# Patient Record
Sex: Female | Born: 1960 | Hispanic: No | Marital: Single | Smoking: Never smoker
Health system: Southern US, Community
[De-identification: ages and names within clinical notes are randomized; demographics above are authoritative.]

---

## 1999-05-10 ENCOUNTER — Ambulatory Visit (HOSPITAL_COMMUNITY): Admission: RE | Admit: 1999-05-10 | Discharge: 1999-05-10 | Payer: Self-pay | Admitting: *Deleted

## 1999-05-11 ENCOUNTER — Ambulatory Visit (HOSPITAL_COMMUNITY): Admission: RE | Admit: 1999-05-11 | Discharge: 1999-05-11 | Payer: Self-pay | Admitting: *Deleted

## 1999-06-16 ENCOUNTER — Inpatient Hospital Stay (HOSPITAL_COMMUNITY): Admission: AD | Admit: 1999-06-16 | Discharge: 1999-06-16 | Payer: Self-pay | Admitting: Obstetrics & Gynecology

## 1999-06-17 ENCOUNTER — Inpatient Hospital Stay (HOSPITAL_COMMUNITY): Admission: AD | Admit: 1999-06-17 | Discharge: 1999-06-19 | Payer: Self-pay | Admitting: *Deleted

## 2000-11-26 ENCOUNTER — Inpatient Hospital Stay (HOSPITAL_COMMUNITY): Admission: AD | Admit: 2000-11-26 | Discharge: 2000-11-26 | Payer: Self-pay | Admitting: *Deleted

## 2000-11-26 ENCOUNTER — Encounter (INDEPENDENT_AMBULATORY_CARE_PROVIDER_SITE_OTHER): Payer: Self-pay | Admitting: Specialist

## 2000-11-29 ENCOUNTER — Inpatient Hospital Stay (HOSPITAL_COMMUNITY): Admission: AD | Admit: 2000-11-29 | Discharge: 2000-11-29 | Payer: Self-pay | Admitting: *Deleted

## 2000-12-06 ENCOUNTER — Inpatient Hospital Stay (HOSPITAL_COMMUNITY): Admission: AD | Admit: 2000-12-06 | Discharge: 2000-12-06 | Payer: Self-pay | Admitting: Obstetrics & Gynecology

## 2000-12-13 ENCOUNTER — Inpatient Hospital Stay (HOSPITAL_COMMUNITY): Admission: AD | Admit: 2000-12-13 | Discharge: 2000-12-13 | Payer: Self-pay | Admitting: Obstetrics & Gynecology

## 2001-01-31 ENCOUNTER — Other Ambulatory Visit: Admission: RE | Admit: 2001-01-31 | Discharge: 2001-01-31 | Payer: Self-pay | Admitting: Family Medicine

## 2005-10-26 ENCOUNTER — Other Ambulatory Visit: Admission: RE | Admit: 2005-10-26 | Discharge: 2005-10-26 | Payer: Self-pay | Admitting: Family Medicine

## 2007-11-01 ENCOUNTER — Emergency Department (HOSPITAL_COMMUNITY): Admission: EM | Admit: 2007-11-01 | Discharge: 2007-11-01 | Payer: Self-pay | Admitting: Emergency Medicine

## 2008-01-13 ENCOUNTER — Other Ambulatory Visit: Admission: RE | Admit: 2008-01-13 | Discharge: 2008-01-13 | Payer: Self-pay | Admitting: Family Medicine

## 2010-02-20 ENCOUNTER — Other Ambulatory Visit: Admission: RE | Admit: 2010-02-20 | Discharge: 2010-02-20 | Payer: Self-pay | Admitting: Family Medicine

## 2011-08-27 LAB — DIFFERENTIAL
Basophils Absolute: 0.1
Basophils Relative: 1
Eosinophils Absolute: 0.1 — ABNORMAL LOW
Eosinophils Relative: 1
Lymphocytes Relative: 20
Lymphs Abs: 1.6
Monocytes Absolute: 0.7
Monocytes Relative: 9
Neutro Abs: 5.4
Neutrophils Relative %: 68

## 2011-08-27 LAB — COMPREHENSIVE METABOLIC PANEL
ALT: 66 — ABNORMAL HIGH
AST: 44 — ABNORMAL HIGH
Albumin: 3.9
Alkaline Phosphatase: 73
BUN: 9
CO2: 29
Calcium: 9.3
Chloride: 105
Creatinine, Ser: 0.67
GFR calc Af Amer: >60
GFR calc non Af Amer: >60
Glucose, Bld: 92
Potassium: 3.7
Sodium: 140
Total Bilirubin: 1.3 — ABNORMAL HIGH
Total Protein: 8.1

## 2011-08-27 LAB — CBC
HCT: 40.3
Hemoglobin: 13.2
MCHC: 32.8
MCV: 82.7
Platelets: 242
RBC: 4.87
RDW: 12.5
WBC: 7.9

## 2013-09-08 ENCOUNTER — Other Ambulatory Visit: Payer: Self-pay | Admitting: Family Medicine

## 2013-09-08 ENCOUNTER — Other Ambulatory Visit (HOSPITAL_COMMUNITY)
Admission: RE | Admit: 2013-09-08 | Discharge: 2013-09-08 | Disposition: A | Discharge status: Home / Self Care | Payer: BC Managed Care – PPO | Source: Ambulatory Visit | Attending: Family Medicine | Admitting: Family Medicine

## 2013-09-08 DIAGNOSIS — Z0000 Encounter for general adult medical examination without abnormal findings: Secondary | ICD-10-CM | POA: Insufficient documentation

## 2017-04-10 LAB — GLUCOSE, POCT (MANUAL RESULT ENTRY): POC Glucose: 128 mg/dl — AB (ref 70–99)

## 2017-04-26 ENCOUNTER — Encounter: Payer: Self-pay | Admitting: Internal Medicine

## 2017-04-26 ENCOUNTER — Ambulatory Visit (HOSPITAL_COMMUNITY)
Admission: RE | Admit: 2017-04-26 | Discharge: 2017-04-26 | Disposition: A | Discharge status: Home / Self Care | Payer: Self-pay | Source: Ambulatory Visit | Attending: Internal Medicine | Admitting: Internal Medicine

## 2017-04-26 ENCOUNTER — Ambulatory Visit: Payer: Self-pay | Attending: Internal Medicine | Admitting: Internal Medicine

## 2017-04-26 DIAGNOSIS — M545 Low back pain, unspecified: Secondary | ICD-10-CM

## 2017-04-26 DIAGNOSIS — Z13220 Encounter for screening for lipoid disorders: Secondary | ICD-10-CM

## 2017-04-26 DIAGNOSIS — M79672 Pain in left foot: Secondary | ICD-10-CM | POA: Insufficient documentation

## 2017-04-26 DIAGNOSIS — H6121 Impacted cerumen, right ear: Secondary | ICD-10-CM | POA: Insufficient documentation

## 2017-04-26 DIAGNOSIS — M7732 Calcaneal spur, left foot: Secondary | ICD-10-CM | POA: Insufficient documentation

## 2017-04-26 DIAGNOSIS — Z860100 Personal history of colon polyps, unspecified: Secondary | ICD-10-CM | POA: Insufficient documentation

## 2017-04-26 DIAGNOSIS — M419 Scoliosis, unspecified: Secondary | ICD-10-CM | POA: Insufficient documentation

## 2017-04-26 DIAGNOSIS — J0100 Acute maxillary sinusitis, unspecified: Secondary | ICD-10-CM

## 2017-04-26 DIAGNOSIS — G8929 Other chronic pain: Secondary | ICD-10-CM | POA: Insufficient documentation

## 2017-04-26 DIAGNOSIS — H538 Other visual disturbances: Secondary | ICD-10-CM

## 2017-04-26 DIAGNOSIS — M4186 Other forms of scoliosis, lumbar region: Secondary | ICD-10-CM | POA: Insufficient documentation

## 2017-04-26 LAB — POCT GLYCOSYLATED HEMOGLOBIN (HGB A1C): Hemoglobin A1C: 5.1

## 2017-04-26 MED ORDER — AMOXICILLIN-POT CLAVULANATE 875-125 MG PO TABS: 1.0000 | ORAL_TABLET | Freq: Two times a day (BID) | ORAL | 0 refills | Status: AC

## 2017-04-26 MED ORDER — NAPROXEN 500 MG PO TABS: 500.0000 mg | ORAL_TABLET | Freq: Two times a day (BID) | ORAL | 0 refills | Status: AC | PRN

## 2017-04-26 MED FILL — AMOX-CLAV 875-125 MG TABLET: 875-125 | 7 days supply | Qty: 14 | Fill #0

## 2017-04-26 MED FILL — NAPROXEN 500 MG TABLET: 500 | 15 days supply | Qty: 30 | Fill #0

## 2017-04-26 NOTE — Addendum Note (Signed)
Addended by: Jonah BlueJOHNSON, Jodette Wik B on: 04/26/2017 06:52 PM   Modules accepted: Orders

## 2017-04-26 NOTE — Addendum Note (Signed)
Addended by: Red ChristiansLISBON, NUBIA on: 04/26/2017 02:53 PM   Modules accepted: Orders

## 2017-04-26 NOTE — Progress Notes (Addendum)
Patient ID: Kayla Rowe, female    DOB: Dec 21, 1960  MRN: [id number]  CC: Establish Care   Subjective: Kayla Rowe is a 56 y.o. female who presents for new pt visit Her concerns today include:  [place] from [place].  [place].  Interpreter from [employer] [place], is with her.  No chronic medical issues   1. Sick since last wk -sneezing, sore throat, nasal congestion, itchy eyes. -A lot of pressure over the last maxillary sinuses. Reports low-grade fevers and sinus drainage -no cough, or SOB taking honey and lemon mixture and Motrin  2. C/o pain in LT heel and left lower back x 3 mths -constant achy and sharp.  Like I'm stepping on a stone. Worse with walking -No known injury to the foot  No injury to back- -pain in lower back LT side started around same time as foot pain -LBP constant, does not radiate -no numbness or tingling --takes Motrin occasionally.   3.  Had colonoscopy 05/2015 through [facility] [place]  Had 3 polys removed.  Brings copy of colonoscopy report.  -Reports states she will be due again in 3-5 years from 2016  4.  Request to see eye doctor.  Had surgery 3 yrs ago ? ptygerygum -blurred vision at times -restricted to driving in day time  No current outpatient prescriptions on file prior to visit.   No current facility-administered medications on file prior to visit.     Allergies not on file  Social History   Social History  . Marital status: Single    Spouse name: N/A  . Number of children: N/A  . Years of education: N/A   Occupational History  . Not on file.   Social History Main Topics  . Smoking status: Never Smoker  . Smokeless tobacco: Never Used  . Alcohol use No  . Drug use: No  . Sexual activity: Not on file   Other Topics Concern  . Not on file   Social History Narrative  . No narrative on file    No family history on file.  No past surgical history on file.  ROS: Review of  Systems As stated above PHYSICAL EXAM: BP 139/71 (BP Location: Left Arm, Patient Position: Sitting, Cuff Size: Normal)   Pulse (!) 58   Temp 98.8 F (37.1 C) (Oral)   Resp 16   Ht 4\' 9"  (1.448 m)   Wt 128 lb 9.6 oz (58.3 kg)   LMP 01/17/2005 (Approximate)   SpO2 100%   BMI 27.83 kg/m   Physical Exam  General appearance - alert, well appearing, middle-aged female in no acute distress  Mental status - patient is alert and answers questions appropriately Ears - some scarring right posterior ear lobe.  Small amount of wax buildup in the right ear obscuring view of the TM.  Left ear canal and membrane within normal limits Nose -moderate enlargement of the nasal turbinates Mouth -no oral lesions, she has significant amount of thick yellow mucus drainage at the back of the throat Neck - no cervical or axillary lymphadenopathy  Chest - clear to auscultation, no wheezes, rales or rhonchi, symmetric air entry Heart - normal rate, regular rhythm, normal S1, S2, no murmurs, rubs, clicks or gallops Musculoskeletal - left foot: no edema.  Mild to moderate tenderness on palpation of the heel.  Lower back:  Mild tenderness on palpation of the left lumbar paraspinal muscles. Straight leg raise negative.  Extremities - no lower  extremity edema Depression screen [facility] 2/9 04/26/2017  Decreased Interest 1  Down, Depressed, Hopeless 0  PHQ - 2 Score 1  Altered sleeping 0  Tired, decreased energy 2  Change in appetite 0  Feeling bad or failure about yourself  0  Trouble concentrating 0  Moving slowly or fidgety/restless 0  Suicidal thoughts 0  PHQ-9 Score 3   GAD 7 : Generalized Anxiety Score 04/26/2017  Nervous, Anxious, on Edge 1  Control/stop worrying 1  Worry too much - different things 1  Trouble relaxing 1  Restless 0  Easily annoyed or irritable 0  Afraid - awful might happen 0  Total GAD 7 Score 4   A1C 5.1`  ASSESSMENT AND PLAN: 1. Acute maxillary sinusitis, recurrence not  specified -She also has some allergy symptoms for which she can use Claritin over-the-counter - CBC - Comprehensive metabolic panel - amoxicillin-clavulanate (AUGMENTIN) 875-125 MG tablet; Take 1 tablet by mouth 2 (two) times daily.  Dispense: 14 tablet; Refill: 0  2. Chronic left-sided low back pain without sciatica -Likely MSK in nature. Avoid any heavy lifting pushing or pulling. - naproxen (NAPROSYN) 500 MG tablet; Take 1 tablet (500 mg total) by mouth 2 (two) times daily as needed.  Dispense: 30 tablet; Refill: 0 - DG Lumbar Spine Complete; Future  3. Pain of left heel -Differential diagnoses include plantar fasciitis versus heel spur -Heel exercises demonstrated and encouraged -Also advise purchasing Dr. Margart Sickles heel inserts over-the-counter and using - DG Foot Complete Left; Future - naproxen (NAPROSYN) 500 MG tablet; Take 1 tablet (500 mg total) by mouth 2 (two) times daily as needed.  Dispense: 30 tablet; Refill: 0  4. Blurred vision - Ambulatory referral to Ophthalmology  5. Hx of colonic polyp -Will be due for repeat colonoscopy in July of next year  6. Screening cholesterol level - Lipid panel  7. Impacted cerumen of right ear Recommended use of over-the-counter wax softener. We can reevaluate on next visit to see if she needs irrigation at that point  Patient was given the opportunity to ask questions.  Patient verbalized understanding of the plan and was able to repeat key elements of the plan.   ADDENDUM:  X-ray foot confirmed bone spurr.  X-ray of LS revealed mild scoliosis.  Will refer to Podiatry.   Orders Placed This Encounter  Procedures  . DG Foot Complete Left  . DG Lumbar Spine Complete  . CBC  . Comprehensive metabolic panel  . Lipid panel  . Ambulatory referral to Ophthalmology     Requested Prescriptions   Signed Prescriptions Disp Refills  . naproxen (NAPROSYN) 500 MG tablet 30 tablet 0    Sig: Take 1 tablet (500 mg total) by mouth 2 (two)  times daily as needed.  Marland Kitchen amoxicillin-clavulanate (AUGMENTIN) 875-125 MG tablet 14 tablet 0    Sig: Take 1 tablet by mouth 2 (two) times daily.   30 minutes spent on direct face-to-face evaluation, counseling, and coordination of care. Return in about 6 weeks (around 06/07/2017) for pap/breast exam.  Jonah Blue, MD, Jerrel Ivory

## 2017-04-26 NOTE — Patient Instructions (Signed)
Give appointment with [name]. Purchase Dr. Margart SicklesScholl's over the counter.  Plantar Fasciitis Plantar fasciitis is a painful foot condition that affects the heel. It occurs when the band of tissue that connects the toes to the heel bone (plantar fascia) becomes irritated. This can happen after exercising too much or doing other repetitive activities (overuse injury). The pain from plantar fasciitis can range from mild irritation to severe pain that makes it difficult for you to walk or move. The pain is usually worse in the morning or after you have been sitting or lying down for a while. What are the causes? This condition may be caused by:  Standing for long periods of time.  Wearing shoes that do not fit.  Doing high-impact activities, including running, aerobics, and ballet.  Being overweight.  Having an abnormal way of walking (gait).  Having tight calf muscles.  Having high arches in your feet.  Starting a new athletic activity.  What are the signs or symptoms? The main symptom of this condition is heel pain. Other symptoms include:  Pain that gets worse after activity or exercise.  Pain that is worse in the morning or after resting.  Pain that goes away after you walk for a few minutes.  How is this diagnosed? This condition may be diagnosed based on your signs and symptoms. Your health care provider will also do a physical exam to check for:  A tender area on the bottom of your foot.  A high arch in your foot.  Pain when you move your foot.  Difficulty moving your foot.  You may also need to have imaging studies to confirm the diagnosis. These can include:  X-rays.  Ultrasound.  MRI.  How is this treated? Treatment for plantar fasciitis depends on the severity of the condition. Your treatment may include:  Rest, ice, and over-the-counter pain medicines to manage your pain.  Exercises to stretch your calves and your plantar fascia.  A splint that holds  your foot in a stretched, upward position while you sleep (night splint).  Physical therapy to relieve symptoms and prevent problems in the future.  Cortisone injections to relieve severe pain.  Extracorporeal shock wave therapy (ESWT) to stimulate damaged plantar fascia with electrical impulses. It is often used as a last resort before surgery.  Surgery, if other treatments have not worked after 12 months.  Follow these instructions at home:  Take medicines only as directed by your health care provider.  Avoid activities that cause pain.  Roll the bottom of your foot over a bag of ice or a bottle of cold water. Do this for 20 minutes, 3-4 times a day.  Perform simple stretches as directed by your health care provider.  Try wearing athletic shoes with air-sole or gel-sole cushions or soft shoe inserts.  Wear a night splint while sleeping, if directed by your health care provider.  Keep all follow-up appointments with your health care provider. How is this prevented?  Do not perform exercises or activities that cause heel pain.  Consider finding low-impact activities if you continue to have problems.  Lose weight if you need to. The best way to prevent plantar fasciitis is to avoid the activities that aggravate your plantar fascia. Contact a health care provider if:  Your symptoms do not go away after treatment with home care measures.  Your pain gets worse.  Your pain affects your ability to move or do your daily activities. This information is not intended to replace  advice given to you by your health care provider. Make sure you discuss any questions you have with your health care provider. Document Released: 07/31/2001 Document Revised: 04/09/2016 Document Reviewed: 09/15/2014 Elsevier Interactive Patient Education  [employer].

## 2017-04-29 ENCOUNTER — Ambulatory Visit: Payer: Self-pay

## 2017-05-15 ENCOUNTER — Ambulatory Visit: Payer: Self-pay

## 2017-05-31 ENCOUNTER — Ambulatory Visit: Payer: Self-pay | Attending: Internal Medicine

## 2017-06-07 ENCOUNTER — Encounter: Payer: Self-pay | Admitting: Internal Medicine

## 2017-06-07 ENCOUNTER — Ambulatory Visit: Payer: Medicaid Other | Attending: Internal Medicine | Admitting: Internal Medicine

## 2017-06-07 ENCOUNTER — Ambulatory Visit: Payer: Self-pay | Admitting: Internal Medicine

## 2017-06-07 ENCOUNTER — Ambulatory Visit: Payer: Self-pay

## 2017-06-07 VITALS — BP 125/70 | HR 72 | Temp 98.2°F | Resp 16 | Wt 129.0 lb

## 2017-06-07 DIAGNOSIS — Z86010 Personal history of colonic polyps: Secondary | ICD-10-CM | POA: Insufficient documentation

## 2017-06-07 DIAGNOSIS — Z124 Encounter for screening for malignant neoplasm of cervix: Secondary | ICD-10-CM

## 2017-06-07 DIAGNOSIS — G8929 Other chronic pain: Secondary | ICD-10-CM | POA: Insufficient documentation

## 2017-06-07 DIAGNOSIS — Z8249 Family history of ischemic heart disease and other diseases of the circulatory system: Secondary | ICD-10-CM | POA: Insufficient documentation

## 2017-06-07 DIAGNOSIS — Z801 Family history of malignant neoplasm of trachea, bronchus and lung: Secondary | ICD-10-CM | POA: Insufficient documentation

## 2017-06-07 DIAGNOSIS — M545 Low back pain: Secondary | ICD-10-CM | POA: Insufficient documentation

## 2017-06-07 DIAGNOSIS — Z0000 Encounter for general adult medical examination without abnormal findings: Secondary | ICD-10-CM | POA: Diagnosis not present

## 2017-06-07 DIAGNOSIS — M79672 Pain in left foot: Secondary | ICD-10-CM | POA: Insufficient documentation

## 2017-06-07 DIAGNOSIS — Z23 Encounter for immunization: Secondary | ICD-10-CM

## 2017-06-07 DIAGNOSIS — Z1239 Encounter for other screening for malignant neoplasm of breast: Secondary | ICD-10-CM

## 2017-06-07 DIAGNOSIS — Z1231 Encounter for screening mammogram for malignant neoplasm of breast: Secondary | ICD-10-CM | POA: Diagnosis not present

## 2017-06-07 NOTE — Progress Notes (Signed)
Patient ID: Kayla Rowe, female    DOB: Dec 02, 1960  MRN: [id number]  CC: Gynecologic Exam   Subjective: Kayla Rowe is a 56 y.o. female who presents for well woman exam Her concerns today include:  Last MMG was 3 yrs ago -no abnormal MMG in past Pt is G3P2 (miscarriage) -last pap was 4 yrs ago. No abnormals -she is postmenopausal and no postmenopausal bleeding -no vaginal dischg or itching -sexually active with husband only   Patient Active Problem List   Diagnosis Date Noted  . Chronic left-sided low back pain without sciatica 04/26/2017  . Pain of left heel 04/26/2017  . Hx of colonic polyp 04/26/2017     Current Outpatient Prescriptions on File Prior to Visit  Medication Sig Dispense Refill  . amoxicillin-clavulanate (AUGMENTIN) 875-125 MG tablet Take 1 tablet by mouth 2 (two) times daily. (Patient not taking: Reported on 06/07/2017) 14 tablet 0  . naproxen (NAPROSYN) 500 MG tablet Take 1 tablet (500 mg total) by mouth 2 (two) times daily as needed. (Patient not taking: Reported on 06/07/2017) 30 tablet 0   No current facility-administered medications on file prior to visit.     Not on File  Social History   Social History  . Marital status: Single    Spouse name: N/A  . Number of children: N/A  . Years of education: N/A   Occupational History  . Not on file.   Social History Main Topics  . Smoking status: Never Smoker  . Smokeless tobacco: Never Used  . Alcohol use No  . Drug use: No  . Sexual activity: Yes    Birth control/ protection: None   Other Topics Concern  . Not on file   Social History Narrative  . No narrative on file    Family History  Problem Relation Age of Onset  . Hypertension Mother   . Asthma Mother   . Lung cancer Father     No past surgical history on file.  ROS: Review of Systems  Constitutional: Negative for appetite change.  Respiratory: Negative for cough, chest tightness and shortness of breath.     Cardiovascular: Negative for chest pain and leg swelling.  Gastrointestinal: Negative for abdominal distention and blood in stool.    PHYSICAL EXAM: BP 125/70   Pulse 72   Temp 98.2 F (36.8 C) (Oral)   Resp 16   Wt 129 lb (58.5 kg)   SpO2 97%   BMI 27.92 kg/m   Physical Exam General appearance - alert, well appearing, and in no distress Mental status - alert, oriented to person, place, and time, normal mood, behavior, speech, dress, motor activity, and thought processes Eyes - pupils equal and reactive, extraocular eye movements intact Mouth - mucous membranes moist, pharynx normal without lesions Neck - supple, no significant adenopathy Lymphatics - no palpable lymphadenopathy, no hepatosplenomegaly Chest - clear to auscultation, no wheezes, rales or rhonchi, symmetric air entry Heart - normal rate, regular rhythm, normal S1, S2, no murmurs, rubs, clicks or gallops Abdomen - soft, nontender, nondistended, no masses or organomegaly Breasts - breasts appear normal, no suspicious masses, no skin or nipple changes or axillary nodes Pelvic - CMA Pollock present. normal external genitalia, vulva, vagina, cervix, uterus and adnexa. She had mild stenosis of cervical canal Extremities - peripheral pulses normal, no pedal edema, no clubbing or cyanosis  ASSESSMENT AND PLAN: 1. Encounter for annual physical exam - CBC - Comprehensive metabolic panel - Lipid panel - Hepatitis c antibody (reflex)  2. Pap smear for cervical cancer screening - Cytology - PAP  3. Breast cancer screening Voucher for MMG given  4. Need for Tdap vaccination    Patient was given the opportunity to ask questions.  Patient verbalized understanding of the plan and was able to repeat key elements of the plan.   Orders Placed This Encounter  Procedures  . MM Digital Screening  . Tdap vaccine greater than or equal to 7yo IM  . CBC  . Comprehensive metabolic panel  . Lipid panel  . Hepatitis c antibody  (reflex)  . HCV Comment:     Requested Prescriptions    No prescriptions requested or ordered in this encounter    Return if symptoms worsen or fail to improve.  Jonah Blueeborah Clifford Coudriet, MD, FACP

## 2017-06-08 LAB — COMPREHENSIVE METABOLIC PANEL
A/G RATIO: 1.3 (ref 1.2–2.2)
ALT: 29 IU/L (ref 0–32)
AST: 29 IU/L (ref 0–40)
Albumin: 4.3 g/dL (ref 3.5–5.5)
Alkaline Phosphatase: 61 IU/L (ref 39–117)
BILIRUBIN TOTAL: 0.8 mg/dL (ref 0.0–1.2)
BUN/Creatinine Ratio: 14 (ref 9–23)
BUN: 10 mg/dL (ref 6–24)
CALCIUM: 9 mg/dL (ref 8.7–10.2)
CHLORIDE: 104 mmol/L (ref 96–106)
CO2: 25 mmol/L (ref 20–29)
Creatinine, Ser: 0.71 mg/dL (ref 0.57–1.00)
GFR calc Af Amer: 111 mL/min/{1.73_m2} (ref >59)
GFR calc non Af Amer: 96 mL/min/{1.73_m2} (ref >59)
Globulin, Total: 3.2 g/dL (ref 1.5–4.5)
Glucose: 88 mg/dL (ref 65–99)
POTASSIUM: 4.5 mmol/L (ref 3.5–5.2)
Sodium: 143 mmol/L (ref 134–144)
Total Protein: 7.5 g/dL (ref 6.0–8.5)

## 2017-06-08 LAB — CBC
Hematocrit: 41.2 % (ref 34.0–46.6)
Hemoglobin: 13 g/dL (ref 11.1–15.9)
MCH: 27 pg (ref 26.6–33.0)
MCHC: 31.6 g/dL (ref 31.5–35.7)
MCV: 86 fL (ref 79–97)
Platelets: 218 10*3/uL (ref 150–379)
RBC: 4.82 x10E6/uL (ref 3.77–5.28)
RDW: 14.2 % (ref 12.3–15.4)
WBC: 5.5 10*3/uL (ref 3.4–10.8)

## 2017-06-08 LAB — HEPATITIS C ANTIBODY (REFLEX): HCV Ab: <0.1 s/co ratio (ref 0.0–0.9)

## 2017-06-08 LAB — LIPID PANEL
Chol/HDL Ratio: 2.9 ratio (ref 0.0–4.4)
Cholesterol, Total: 156 mg/dL (ref 100–199)
HDL: 53 mg/dL (ref >39)
LDL Calculated: 80 mg/dL (ref 0–99)
TRIGLYCERIDES: 114 mg/dL (ref 0–149)
VLDL Cholesterol Cal: 23 mg/dL (ref 5–40)

## 2017-06-08 LAB — HCV COMMENT:

## 2017-06-11 LAB — CYTOLOGY - PAP
Diagnosis: NEGATIVE
HPV: NOT DETECTED

## 2017-06-13 NOTE — Patient Instructions (Signed)
Td Vaccine (Tetanus and Diphtheria): What You Need to Know 1. Why get vaccinated? Tetanus  and diphtheria are very serious diseases. They are rare in the [place] today, but people who do become infected often have severe complications. Td vaccine is used to protect adolescents and adults from both of these diseases. Both tetanus and diphtheria are infections caused by bacteria. Diphtheria spreads from person to person through coughing or sneezing. Tetanus-causing bacteria enter the body through cuts, scratches, or wounds. TETANUS (lockjaw) causes painful muscle tightening and stiffness, usually all over the body.  It can lead to tightening of muscles in the head and neck so you can't open your mouth, swallow, or sometimes even breathe. Tetanus kills about 1 out of every 10 people who are infected even after receiving the best medical care.  DIPHTHERIA can cause a thick coating to form in the back of the throat.  It can lead to breathing problems, paralysis, heart failure, and death.  Before vaccines, as many as 200,000 cases of diphtheria and hundreds of cases of tetanus were reported in the [place] each year. Since vaccination began, reports of cases for both diseases have dropped by about 99%. 2. Td vaccine Td vaccine can protect adolescents and adults from tetanus and diphtheria. Td is usually given as a booster dose every 10 years but it can also be given earlier after a severe and dirty wound or burn. Another vaccine, called Tdap, which protects against pertussis in addition to tetanus and diphtheria, is sometimes recommended instead of Td vaccine. Your doctor or the person giving you the vaccine can give you more information. Td may safely be given at the same time as other vaccines. 3. Some people should not get this vaccine  A person who has ever had a life-threatening allergic reaction after a previous dose of any tetanus or diphtheria containing vaccine, OR has a severe  allergy to any part of this vaccine, should not get Td vaccine. Tell the person giving the vaccine about any severe allergies.  Talk to your doctor if you: ? had severe pain or swelling after any vaccine containing diphtheria or tetanus, ? ever had a condition called Guillain Barre Syndrome (GBS), ? aren't feeling well on the day the shot is scheduled. 4. What are the risks from Td vaccine? With any medicine, including vaccines, there is a chance of side effects. These are usually mild and go away on their own. Serious reactions are also possible but are rare. Most people who get Td vaccine do not have any problems with it. Mild problems following Td vaccine: (Did not interfere with activities)  Pain where the shot was given (about 8 people in 10)  Redness or swelling where the shot was given (about 1 person in 4)  Mild fever (rare)  Headache (about 1 person in 4)  Tiredness (about 1 person in 4)  Moderate problems following Td vaccine: (Interfered with activities, but did not require medical attention)  Fever over 102F (rare)  Severe problems following Td vaccine: (Unable to perform usual activities; required medical attention)  Swelling, severe pain, bleeding and/or redness in the arm where the shot was given (rare).  Problems that could happen after any vaccine:  People sometimes faint after a medical procedure, including vaccination. Sitting or lying down for about 15 minutes can help prevent fainting, and injuries caused by a fall. Tell your doctor if you feel dizzy, or have vision changes or ringing in the ears.  Some people get   severe pain in the shoulder and have difficulty moving the arm where a shot was given. This happens very rarely.  Any medication can cause a severe allergic reaction. Such reactions from a vaccine are very rare, estimated at fewer than 1 in a million doses, and would happen within a few minutes to a few hours after the vaccination. As with any  medicine, there is a very remote chance of a vaccine causing a serious injury or death. The safety of vaccines is always being monitored. For more information, visit: www.cdc.gov/vaccinesafety/ 5. What if there is a serious reaction? What should I look for? Look for anything that concerns you, such as signs of a severe allergic reaction, very high fever, or unusual behavior. Signs of a severe allergic reaction can include hives, swelling of the face and throat, difficulty breathing, a fast heartbeat, dizziness, and weakness. These would usually start a few minutes to a few hours after the vaccination. What should I do?  If you think it is a severe allergic reaction or other emergency that can't wait, call 9-1-1 or get the person to the nearest hospital. Otherwise, call your doctor.  Afterward, the reaction should be reported to the Vaccine Adverse Event Reporting System (VAERS). Your doctor might file this report, or you can do it yourself through the VAERS web site at www.vaers.hhs.gov, or by calling 1-800-822-7967. ? VAERS does not give medical advice. 6. The National Vaccine Injury Compensation Program The National Vaccine Injury Compensation Program (VICP) is a federal program that was created to compensate people who may have been injured by certain vaccines. Persons who believe they may have been injured by a vaccine can learn about the program and about filing a claim by calling 1-800-338-2382 or visiting the VICP website at www.hrsa.gov/vaccinecompensation. There is a time limit to file a claim for compensation. 7. How can I learn more?  Ask your doctor. He or she can give you the vaccine package insert or suggest other sources of information.  Call your local or state health department.  Contact the Centers for Disease Control and Prevention (CDC): ? Call 1-800-232-4636 (1-800-CDC-INFO) ? Visit CDC's website at www.cdc.gov/vaccines CDC Td Vaccine VIS (02/28/16) This information is  not intended to replace advice given to you by your health care provider. Make sure you discuss any questions you have with your health care provider. Document Released: 09/02/2006 Document Revised: 07/26/2016 Document Reviewed: 07/26/2016 Elsevier Interactive Patient Education  2017 Elsevier Inc.  

## 2017-06-21 ENCOUNTER — Ambulatory Visit: Payer: Self-pay | Attending: Internal Medicine

## 2020-09-15 DIAGNOSIS — Z131 Encounter for screening for diabetes mellitus: Secondary | ICD-10-CM | POA: Diagnosis not present

## 2020-09-15 DIAGNOSIS — M549 Dorsalgia, unspecified: Secondary | ICD-10-CM | POA: Diagnosis not present

## 2020-09-15 DIAGNOSIS — M62830 Muscle spasm of back: Secondary | ICD-10-CM | POA: Diagnosis not present

## 2020-09-15 DIAGNOSIS — R252 Cramp and spasm: Secondary | ICD-10-CM | POA: Diagnosis not present

## 2020-11-04 DIAGNOSIS — R059 Cough, unspecified: Secondary | ICD-10-CM | POA: Diagnosis not present

## 2020-11-04 DIAGNOSIS — Z20828 Contact with and (suspected) exposure to other viral communicable diseases: Secondary | ICD-10-CM | POA: Diagnosis not present

## 2020-12-12 DIAGNOSIS — Z20828 Contact with and (suspected) exposure to other viral communicable diseases: Secondary | ICD-10-CM | POA: Diagnosis not present

## 2020-12-16 DIAGNOSIS — Z20828 Contact with and (suspected) exposure to other viral communicable diseases: Secondary | ICD-10-CM | POA: Diagnosis not present

## 2020-12-17 DIAGNOSIS — Z20828 Contact with and (suspected) exposure to other viral communicable diseases: Secondary | ICD-10-CM | POA: Diagnosis not present

## 2020-12-31 DIAGNOSIS — Z20828 Contact with and (suspected) exposure to other viral communicable diseases: Secondary | ICD-10-CM | POA: Diagnosis not present

## 2021-01-09 DIAGNOSIS — Z20828 Contact with and (suspected) exposure to other viral communicable diseases: Secondary | ICD-10-CM | POA: Diagnosis not present

## 2021-01-10 DIAGNOSIS — Z20828 Contact with and (suspected) exposure to other viral communicable diseases: Secondary | ICD-10-CM | POA: Diagnosis not present

## 2021-01-16 DIAGNOSIS — Z20828 Contact with and (suspected) exposure to other viral communicable diseases: Secondary | ICD-10-CM | POA: Diagnosis not present

## 2021-01-17 DIAGNOSIS — Z20828 Contact with and (suspected) exposure to other viral communicable diseases: Secondary | ICD-10-CM | POA: Diagnosis not present

## 2021-01-23 DIAGNOSIS — Z20828 Contact with and (suspected) exposure to other viral communicable diseases: Secondary | ICD-10-CM | POA: Diagnosis not present

## 2021-01-24 DIAGNOSIS — Z20828 Contact with and (suspected) exposure to other viral communicable diseases: Secondary | ICD-10-CM | POA: Diagnosis not present

## 2021-01-30 DIAGNOSIS — Z20828 Contact with and (suspected) exposure to other viral communicable diseases: Secondary | ICD-10-CM | POA: Diagnosis not present

## 2021-01-31 DIAGNOSIS — Z20828 Contact with and (suspected) exposure to other viral communicable diseases: Secondary | ICD-10-CM | POA: Diagnosis not present

## 2021-02-06 DIAGNOSIS — Z20828 Contact with and (suspected) exposure to other viral communicable diseases: Secondary | ICD-10-CM | POA: Diagnosis not present

## 2021-02-07 DIAGNOSIS — Z20828 Contact with and (suspected) exposure to other viral communicable diseases: Secondary | ICD-10-CM | POA: Diagnosis not present

## 2021-02-13 DIAGNOSIS — Z20828 Contact with and (suspected) exposure to other viral communicable diseases: Secondary | ICD-10-CM | POA: Diagnosis not present

## 2021-02-14 DIAGNOSIS — Z20828 Contact with and (suspected) exposure to other viral communicable diseases: Secondary | ICD-10-CM | POA: Diagnosis not present

## 2021-02-20 DIAGNOSIS — Z20828 Contact with and (suspected) exposure to other viral communicable diseases: Secondary | ICD-10-CM | POA: Diagnosis not present

## 2021-02-21 DIAGNOSIS — Z20828 Contact with and (suspected) exposure to other viral communicable diseases: Secondary | ICD-10-CM | POA: Diagnosis not present

## 2021-02-27 DIAGNOSIS — Z20828 Contact with and (suspected) exposure to other viral communicable diseases: Secondary | ICD-10-CM | POA: Diagnosis not present

## 2021-02-28 DIAGNOSIS — Z20828 Contact with and (suspected) exposure to other viral communicable diseases: Secondary | ICD-10-CM | POA: Diagnosis not present

## 2021-03-08 DIAGNOSIS — S0083XA Contusion of other part of head, initial encounter: Secondary | ICD-10-CM | POA: Diagnosis not present

## 2021-03-08 DIAGNOSIS — H5340 Unspecified visual field defects: Secondary | ICD-10-CM | POA: Diagnosis not present

## 2021-03-08 DIAGNOSIS — R202 Paresthesia of skin: Secondary | ICD-10-CM | POA: Diagnosis not present

## 2021-03-08 DIAGNOSIS — M545 Low back pain, unspecified: Secondary | ICD-10-CM | POA: Diagnosis not present

## 2021-03-10 ENCOUNTER — Other Ambulatory Visit: Payer: Self-pay | Admitting: Family Medicine

## 2021-03-10 ENCOUNTER — Ambulatory Visit
Admission: RE | Admit: 2021-03-10 | Discharge: 2021-03-10 | Disposition: A | Discharge status: Home / Self Care | Payer: BC Managed Care – PPO | Source: Ambulatory Visit | Attending: Family Medicine | Admitting: Family Medicine

## 2021-03-10 DIAGNOSIS — Z8739 Personal history of other diseases of the musculoskeletal system and connective tissue: Secondary | ICD-10-CM | POA: Diagnosis not present

## 2021-03-10 DIAGNOSIS — M25561 Pain in right knee: Secondary | ICD-10-CM

## 2021-03-10 DIAGNOSIS — S0083XA Contusion of other part of head, initial encounter: Secondary | ICD-10-CM

## 2021-03-10 DIAGNOSIS — M25562 Pain in left knee: Secondary | ICD-10-CM

## 2021-03-13 DIAGNOSIS — Z20828 Contact with and (suspected) exposure to other viral communicable diseases: Secondary | ICD-10-CM | POA: Diagnosis not present

## 2021-03-14 DIAGNOSIS — Z20828 Contact with and (suspected) exposure to other viral communicable diseases: Secondary | ICD-10-CM | POA: Diagnosis not present

## 2021-03-20 DIAGNOSIS — Z20828 Contact with and (suspected) exposure to other viral communicable diseases: Secondary | ICD-10-CM | POA: Diagnosis not present

## 2021-03-20 DIAGNOSIS — R059 Cough, unspecified: Secondary | ICD-10-CM | POA: Diagnosis not present

## 2021-03-21 DIAGNOSIS — R059 Cough, unspecified: Secondary | ICD-10-CM | POA: Diagnosis not present

## 2021-03-21 DIAGNOSIS — Z20828 Contact with and (suspected) exposure to other viral communicable diseases: Secondary | ICD-10-CM | POA: Diagnosis not present

## 2021-03-27 DIAGNOSIS — R059 Cough, unspecified: Secondary | ICD-10-CM | POA: Diagnosis not present

## 2021-03-27 DIAGNOSIS — Z20828 Contact with and (suspected) exposure to other viral communicable diseases: Secondary | ICD-10-CM | POA: Diagnosis not present

## 2021-03-28 DIAGNOSIS — R059 Cough, unspecified: Secondary | ICD-10-CM | POA: Diagnosis not present

## 2021-03-28 DIAGNOSIS — Z20828 Contact with and (suspected) exposure to other viral communicable diseases: Secondary | ICD-10-CM | POA: Diagnosis not present

## 2021-04-03 DIAGNOSIS — Z20828 Contact with and (suspected) exposure to other viral communicable diseases: Secondary | ICD-10-CM | POA: Diagnosis not present

## 2021-04-10 DIAGNOSIS — Z20828 Contact with and (suspected) exposure to other viral communicable diseases: Secondary | ICD-10-CM | POA: Diagnosis not present

## 2021-04-10 DIAGNOSIS — R059 Cough, unspecified: Secondary | ICD-10-CM | POA: Diagnosis not present

## 2021-04-11 DIAGNOSIS — R059 Cough, unspecified: Secondary | ICD-10-CM | POA: Diagnosis not present

## 2021-04-11 DIAGNOSIS — Z20828 Contact with and (suspected) exposure to other viral communicable diseases: Secondary | ICD-10-CM | POA: Diagnosis not present

## 2021-04-18 DIAGNOSIS — Z20828 Contact with and (suspected) exposure to other viral communicable diseases: Secondary | ICD-10-CM | POA: Diagnosis not present

## 2021-04-18 DIAGNOSIS — R059 Cough, unspecified: Secondary | ICD-10-CM | POA: Diagnosis not present

## 2021-04-19 DIAGNOSIS — Z20828 Contact with and (suspected) exposure to other viral communicable diseases: Secondary | ICD-10-CM | POA: Diagnosis not present

## 2021-04-19 DIAGNOSIS — R059 Cough, unspecified: Secondary | ICD-10-CM | POA: Diagnosis not present

## 2021-04-25 DIAGNOSIS — Z20828 Contact with and (suspected) exposure to other viral communicable diseases: Secondary | ICD-10-CM | POA: Diagnosis not present

## 2021-04-26 DIAGNOSIS — Z20828 Contact with and (suspected) exposure to other viral communicable diseases: Secondary | ICD-10-CM | POA: Diagnosis not present

## 2021-05-01 DIAGNOSIS — R059 Cough, unspecified: Secondary | ICD-10-CM | POA: Diagnosis not present

## 2021-05-01 DIAGNOSIS — Z20828 Contact with and (suspected) exposure to other viral communicable diseases: Secondary | ICD-10-CM | POA: Diagnosis not present

## 2021-05-02 DIAGNOSIS — R059 Cough, unspecified: Secondary | ICD-10-CM | POA: Diagnosis not present

## 2021-05-02 DIAGNOSIS — Z20828 Contact with and (suspected) exposure to other viral communicable diseases: Secondary | ICD-10-CM | POA: Diagnosis not present

## 2021-07-06 DIAGNOSIS — H52223 Regular astigmatism, bilateral: Secondary | ICD-10-CM | POA: Diagnosis not present

## 2021-07-06 DIAGNOSIS — H5203 Hypermetropia, bilateral: Secondary | ICD-10-CM | POA: Diagnosis not present

## 2021-07-06 DIAGNOSIS — H04123 Dry eye syndrome of bilateral lacrimal glands: Secondary | ICD-10-CM | POA: Diagnosis not present

## 2021-07-06 DIAGNOSIS — H524 Presbyopia: Secondary | ICD-10-CM | POA: Diagnosis not present

## 2021-12-07 IMAGING — CR DG KNEE 1-2V*L*
2 series · 2 of 2 positions shown · non-contrast
Comparison: None.

CLINICAL DATA: Left knee pain.

EXAM:
LEFT KNEE - 1-2 VIEW

[t knee ap left]
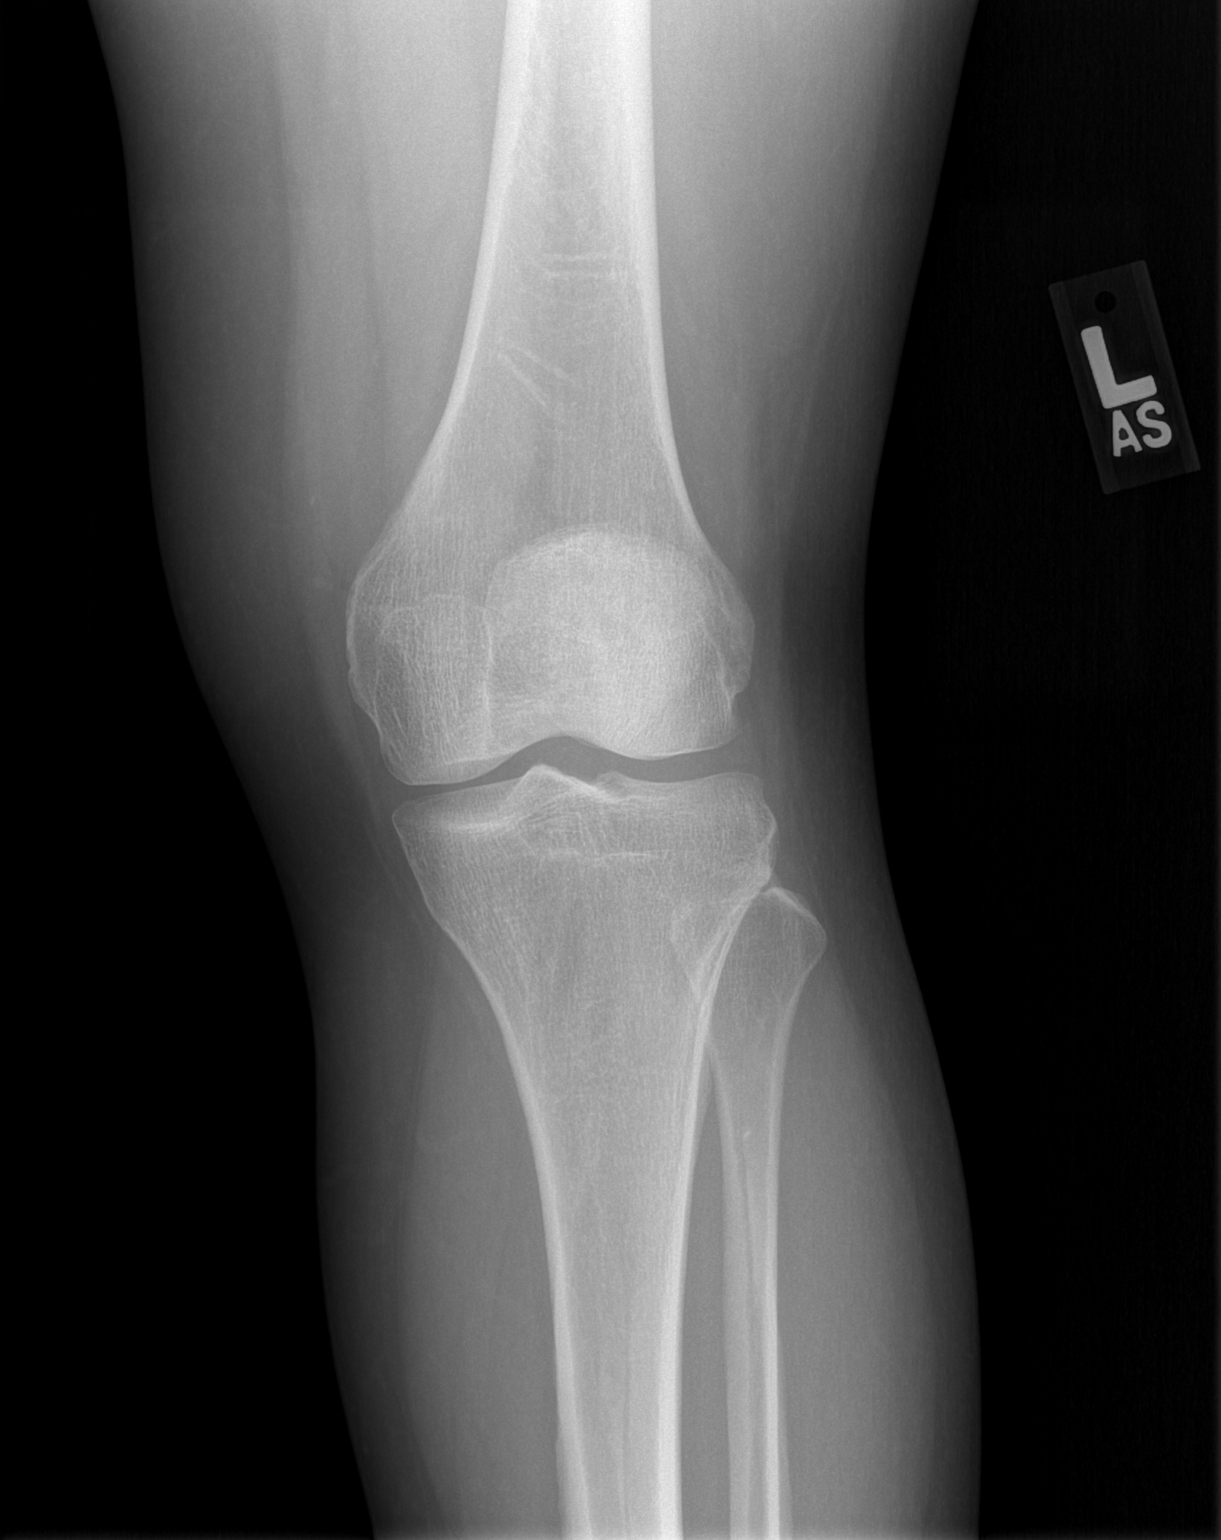

[t knee lat left]
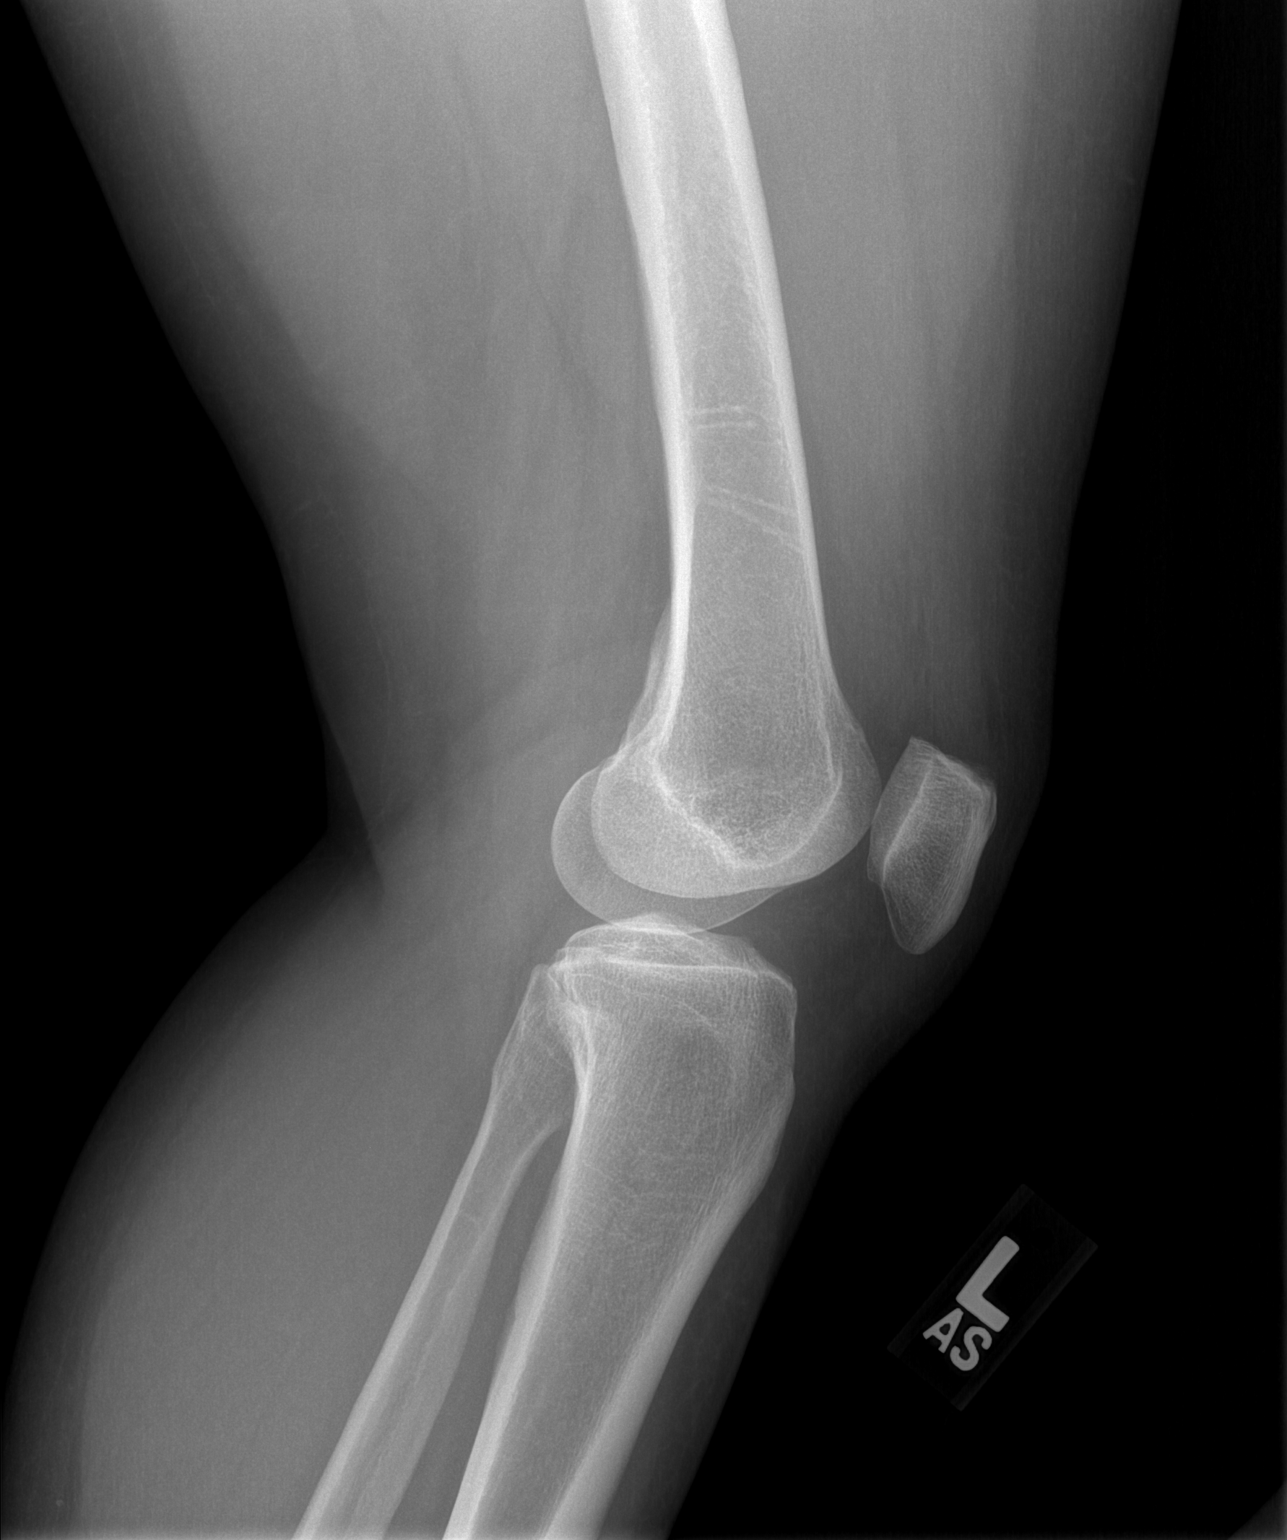

[2 of 2 positions shown; findings below may reference images not displayed]

FINDINGS: No acute fracture, dislocation, or knee joint effusion is
identified. Femorotibial joint space widths are preserved. The soft
tissues are unremarkable.
IMPRESSION: Negative.

## 2021-12-07 IMAGING — CR DG FACIAL BONES COMPLETE 3+V
6 series · 6 of 6 positions shown · non-contrast
Comparison: None.

CLINICAL DATA: Status post fall 2 months ago with contusion of
face. Left maxillary and zygomatic arch pain and swelling.

EXAM:
FACIAL BONES COMPLETE 3+V

[[person_name] (1 of 2)]
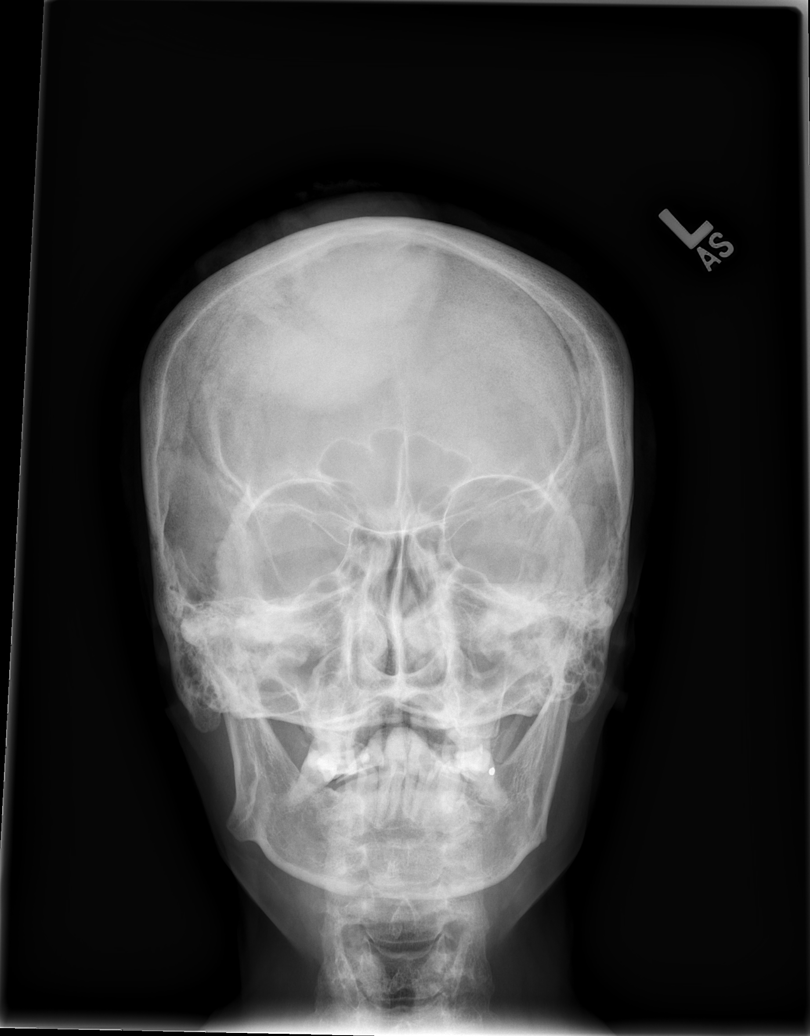

[w waters]
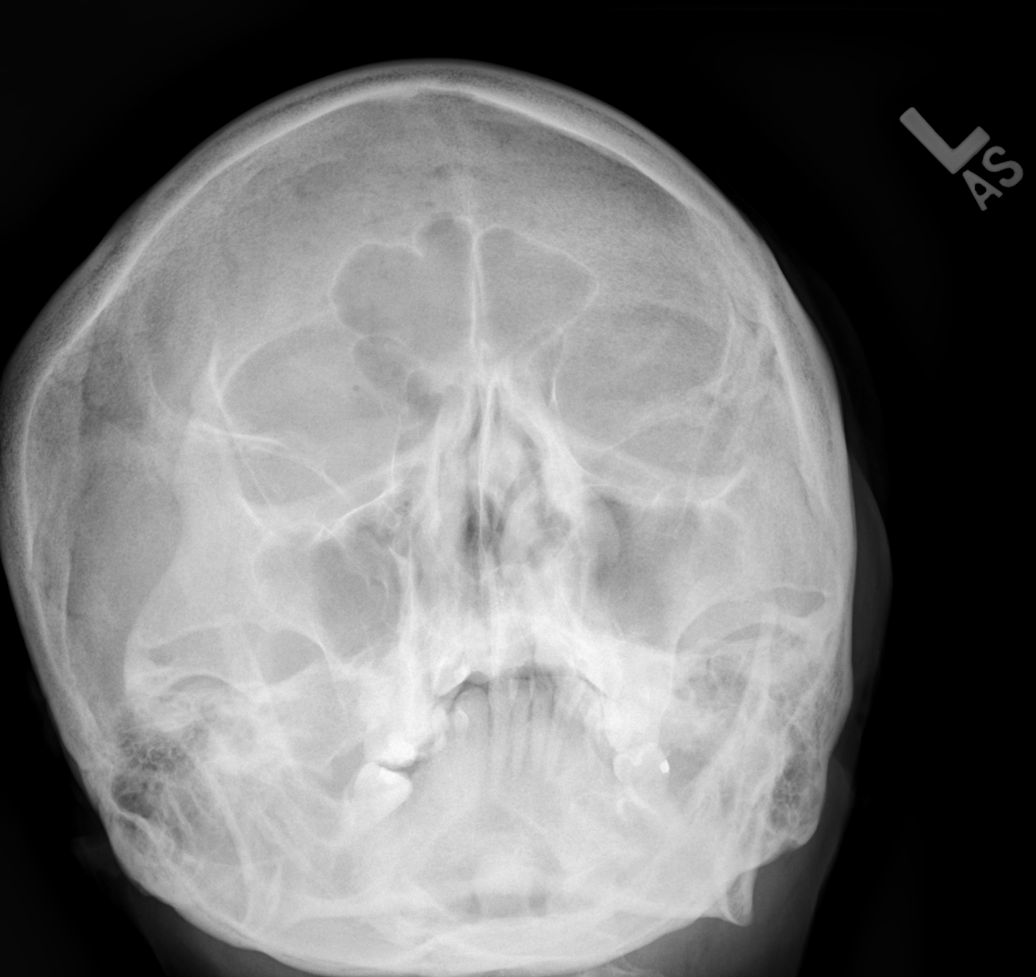

[w skull lat]
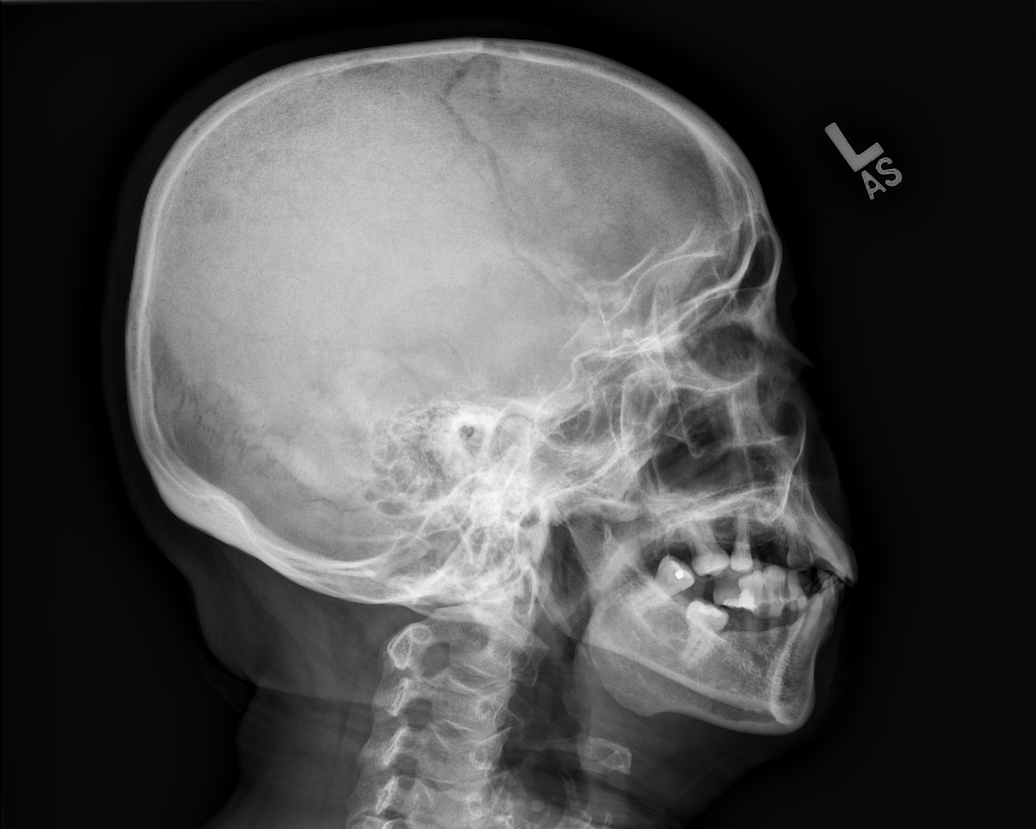

[[person_name] (2 of 2)]
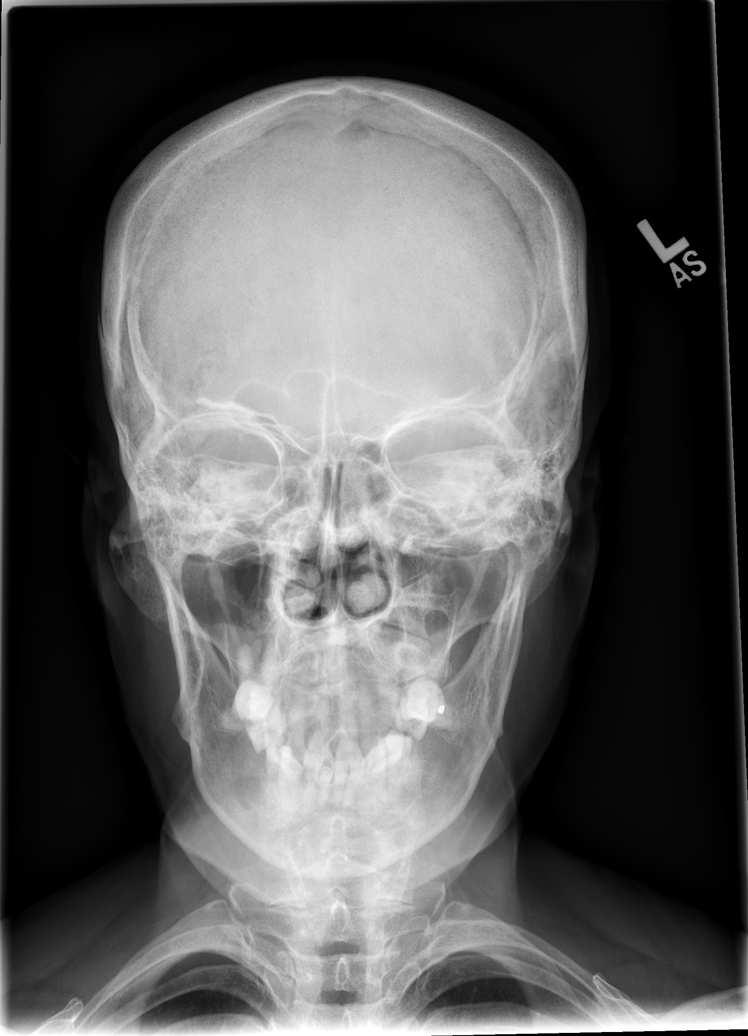

[w smv (1 of 2)]
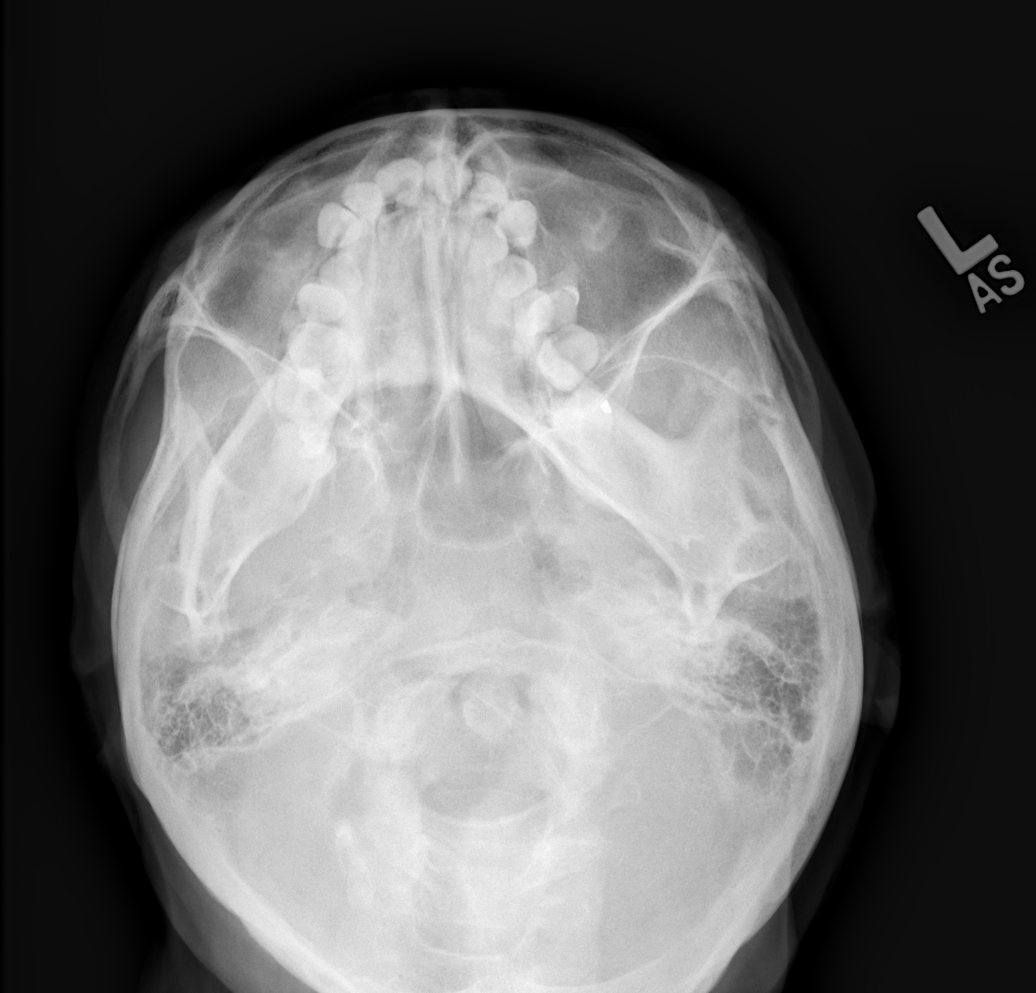

[w smv (2 of 2)]
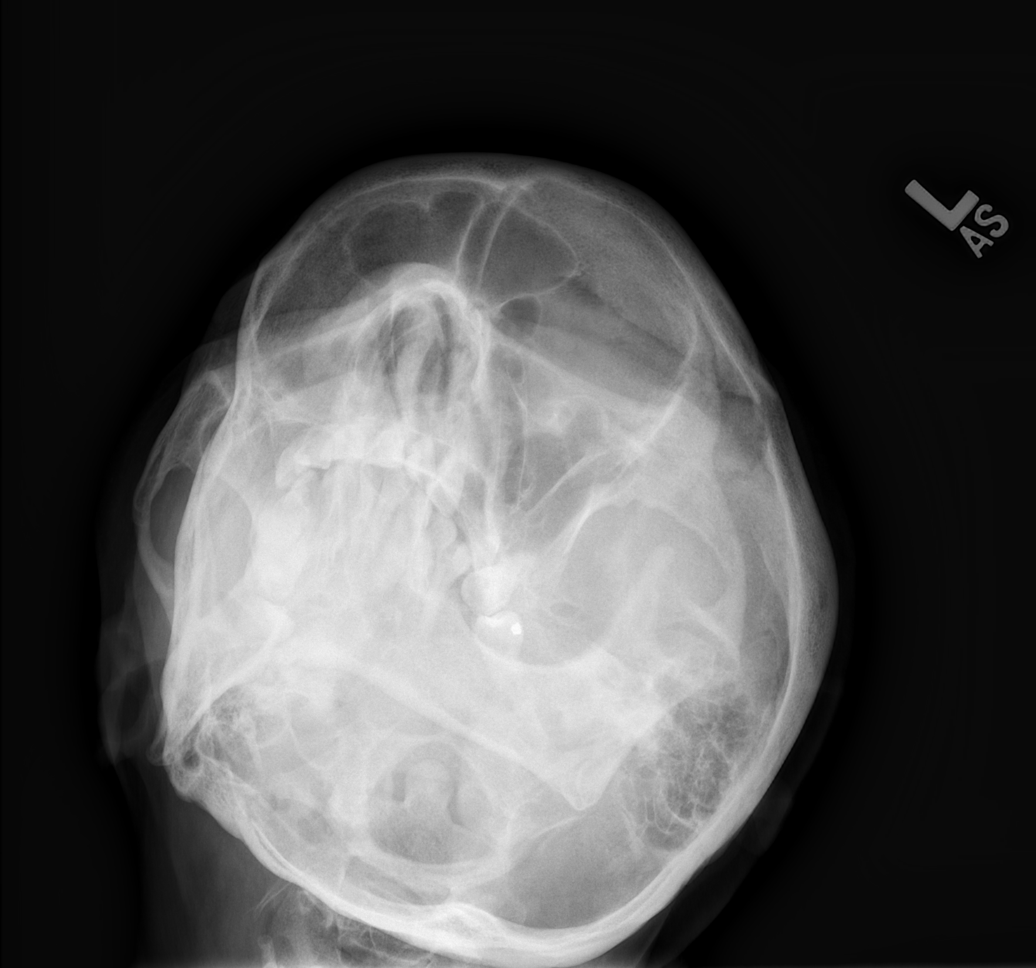

[6 of 6 positions shown; findings below may reference images not displayed]

FINDINGS: There is no evidence of fracture or other significant bone
abnormality. No orbital emphysema or sinus air-fluid levels are
seen.
IMPRESSION: Negative.

## 2022-06-28 DIAGNOSIS — Z0184 Encounter for antibody response examination: Secondary | ICD-10-CM | POA: Diagnosis not present

## 2022-06-28 DIAGNOSIS — Z0000 Encounter for general adult medical examination without abnormal findings: Secondary | ICD-10-CM | POA: Diagnosis not present

## 2022-06-28 DIAGNOSIS — Z13220 Encounter for screening for lipoid disorders: Secondary | ICD-10-CM | POA: Diagnosis not present

## 2022-06-28 DIAGNOSIS — Z124 Encounter for screening for malignant neoplasm of cervix: Secondary | ICD-10-CM | POA: Diagnosis not present

## 2022-07-20 DIAGNOSIS — Z23 Encounter for immunization: Secondary | ICD-10-CM | POA: Diagnosis not present

## 2022-08-10 DIAGNOSIS — Z1231 Encounter for screening mammogram for malignant neoplasm of breast: Secondary | ICD-10-CM | POA: Diagnosis not present

## 2022-08-28 DIAGNOSIS — J209 Acute bronchitis, unspecified: Secondary | ICD-10-CM | POA: Diagnosis not present

## 2022-09-06 DIAGNOSIS — J40 Bronchitis, not specified as acute or chronic: Secondary | ICD-10-CM | POA: Diagnosis not present

## 2022-09-06 DIAGNOSIS — R21 Rash and other nonspecific skin eruption: Secondary | ICD-10-CM | POA: Diagnosis not present

## 2022-09-19 DIAGNOSIS — Z23 Encounter for immunization: Secondary | ICD-10-CM | POA: Diagnosis not present

## 2023-12-31 ENCOUNTER — Emergency Department (HOSPITAL_COMMUNITY)
Admission: EM | Admit: 2023-12-31 | Discharge: 2024-01-01 | Disposition: A | Discharge status: Home / Self Care | Payer: 59 | Attending: Emergency Medicine | Admitting: Emergency Medicine

## 2023-12-31 ENCOUNTER — Emergency Department (HOSPITAL_COMMUNITY): Payer: 59

## 2023-12-31 DIAGNOSIS — J40 Bronchitis, not specified as acute or chronic: Secondary | ICD-10-CM | POA: Diagnosis not present

## 2023-12-31 DIAGNOSIS — R0602 Shortness of breath: Secondary | ICD-10-CM | POA: Diagnosis present

## 2023-12-31 LAB — CBC WITH DIFFERENTIAL/PLATELET
Abs Immature Granulocytes: 0.03 10*3/uL (ref 0.00–0.07)
Basophils Absolute: 0 10*3/uL (ref 0.0–0.1)
Basophils Relative: 1 %
Eosinophils Absolute: 0.1 10*3/uL (ref 0.0–0.5)
Eosinophils Relative: 1 %
HCT: 42.7 % (ref 36.0–46.0)
Hemoglobin: 13.6 g/dL (ref 12.0–15.0)
Immature Granulocytes: 0 %
Lymphocytes Relative: 29 %
Lymphs Abs: 2.5 10*3/uL (ref 0.7–4.0)
MCH: 27.5 pg (ref 26.0–34.0)
MCHC: 31.9 g/dL (ref 30.0–36.0)
MCV: 86.3 fL (ref 80.0–100.0)
Monocytes Absolute: 0.5 10*3/uL (ref 0.1–1.0)
Monocytes Relative: 6 %
Neutro Abs: 5.4 10*3/uL (ref 1.7–7.7)
Neutrophils Relative %: 63 %
Platelets: 213 10*3/uL (ref 150–400)
RBC: 4.95 MIL/uL (ref 3.87–5.11)
RDW: 13.7 % (ref 11.5–15.5)
WBC: 8.5 10*3/uL (ref 4.0–10.5)
nRBC: 0 % (ref 0.0–0.2)

## 2023-12-31 LAB — BASIC METABOLIC PANEL
Anion gap: 12 (ref 5–15)
BUN: 6 mg/dL — ABNORMAL LOW (ref 8–23)
CO2: 26 mmol/L (ref 22–32)
Calcium: 9.3 mg/dL (ref 8.9–10.3)
Chloride: 103 mmol/L (ref 98–111)
Creatinine, Ser: 0.63 mg/dL (ref 0.44–1.00)
GFR, Estimated: >60 mL/min (ref >60)
Glucose, Bld: 95 mg/dL (ref 70–99)
Potassium: 3.3 mmol/L — ABNORMAL LOW (ref 3.5–5.1)
Sodium: 141 mmol/L (ref 135–145)

## 2023-12-31 MED ORDER — SODIUM CHLORIDE 0.9 % IV BOLUS
1000.0000 mL | Freq: Once | INTRAVENOUS | Status: AC
  Administered 2024-01-01: 1000 mL via INTRAVENOUS

## 2023-12-31 MED ORDER — SODIUM CHLORIDE 0.9 % IV SOLN
2.0000 g | Freq: Once | INTRAVENOUS | Status: AC
  Administered 2024-01-01: 2 g via INTRAVENOUS
  Filled 2023-12-31: qty 12.5

## 2023-12-31 MED ORDER — IPRATROPIUM BROMIDE 0.02 % IN SOLN
0.5000 mg | Freq: Once | RESPIRATORY_TRACT | Status: AC
  Administered 2023-12-31: 0.5 mg via RESPIRATORY_TRACT
  Filled 2023-12-31: qty 2.5

## 2023-12-31 MED ORDER — DOXYCYCLINE HYCLATE 100 MG PO TABS
100.0000 mg | ORAL_TABLET | Freq: Once | ORAL | Status: AC
  Administered 2023-12-31: 100 mg via ORAL
  Filled 2023-12-31: qty 1

## 2023-12-31 NOTE — ED Triage Notes (Signed)
Pt to ED C/o cough and SHOB with exertion, Pt was seen on 2/7 at PCP and dx with pneumonia. Started on prednisone, amoxicillin, azithromycin. Pt taking as prescribed. Here today because not feeling better.

## 2023-12-31 NOTE — ED Provider Notes (Signed)
[place] EMERGENCY DEPARTMENT AT [facility] Provider Note   CSN: [id number] Arrival date & time: 12/31/23  1837     History {Add pertinent medical, surgical, social history, OB history to HPI:1} Chief Complaint  Patient presents with   Cough   Shortness of Breath    Kayla Rowe is a 63 y.o. female, no pertinent past medical history, who presents to the ED secondary to shortness of breath, cough, that is been going on for the last 2 weeks.  She says that she has been dealing with this for the last 2 weeks, saw her PCP on 2/7, and was diagnosed with pneumonia.  Took Augmentin, and azithromycin, as well as prednisone without any relief.  She states her shortness of breath is gotten worse, and she feels very winded even walking, and that she has a productive cough.  She now also has central chest pain, is worse when coughing, or taking a deep breath.  She feels very rundown, and cannot sleep because she cannot breathe.  Home Medications Prior to Admission medications   Medication Sig Start Date End Date Taking? Authorizing Provider  amoxicillin-clavulanate (AUGMENTIN) 875-125 MG tablet Take 1 tablet by mouth 2 (two) times daily. Patient not taking: Reported on 06/07/2017 04/26/17   Marcine Matar, MD  naproxen (NAPROSYN) 500 MG tablet Take 1 tablet (500 mg total) by mouth 2 (two) times daily as needed. Patient not taking: Reported on 06/07/2017 04/26/17   Marcine Matar, MD      Allergies    Patient has no allergy information on record.    Review of Systems   Review of Systems  Respiratory:  Positive for cough and shortness of breath.   Cardiovascular:  Positive for chest pain.  Gastrointestinal:  Negative for abdominal pain.    Physical Exam Updated Vital Signs BP (!) 185/93 (BP Location: Right Arm)   Pulse 72   Temp 98.1 F (36.7 C)   Resp (!) 22   SpO2 98%  Physical Exam Vitals and nursing note reviewed.  Constitutional:      General: She is not in acute  distress.    Appearance: She is well-developed.  HENT:     Head: Normocephalic and atraumatic.  Eyes:     Conjunctiva/sclera: Conjunctivae normal.  Cardiovascular:     Rate and Rhythm: Normal rate and regular rhythm.     Heart sounds: No murmur heard. Pulmonary:     Effort: No respiratory distress.     Comments: Audible expiratory and inspiratory wheezing, coarse breath sounds throughout. Abdominal:     Palpations: Abdomen is soft.     Tenderness: There is no abdominal tenderness.  Musculoskeletal:        General: No swelling.     Cervical back: Neck supple.  Skin:    General: Skin is warm and dry.     Capillary Refill: Capillary refill takes less than 2 seconds.  Neurological:     Mental Status: She is alert.  Psychiatric:        Mood and Affect: Mood normal.     ED Results / Procedures / Treatments   Labs (all labs ordered are listed, but only abnormal results are displayed) Labs Reviewed  BASIC METABOLIC PANEL - Abnormal; Notable for the following components:      Result Value   Potassium 3.3 (*)    BUN 6 (*)    All other components within normal limits  CBC WITH DIFFERENTIAL/PLATELET    EKG None  Radiology  DG Chest 2 View Result Date: 12/31/2023 CLINICAL DATA:  Shortness of breath EXAM: CHEST - 2 VIEW COMPARISON:  12/27/2023 FINDINGS: The heart size and mediastinal contours are within normal limits. Mild streaky lingular opacity. Lungs appear otherwise clear. No pleural effusion or pneumothorax. The visualized skeletal structures are unremarkable. IMPRESSION: Mild streaky lingular opacity, which may represent atelectasis versus pneumonia. Electronically Signed   By: Duanne Guess D.O.   On: 12/31/2023 20:29    Procedures Procedures  {Document cardiac monitor, telemetry assessment procedure when appropriate:1}  Medications Ordered in ED Medications - No data to display  ED Course/ Medical Decision Making/ A&P   {   Click here for ABCD2, HEART and other  calculatorsREFRESH Note before signing :1}                              Medical Decision Making Amount and/or Complexity of Data Reviewed Labs: ordered. Radiology: ordered.  Risk Prescription drug management.   ***  {Document critical care time when appropriate:1} {Document review of labs and clinical decision tools ie heart score, Chads2Vasc2 etc:1}  {Document your independent review of radiology images, and any outside records:1} {Document your discussion with family members, caretakers, and with consultants:1} {Document social determinants of health affecting pt's care:1} {Document your decision making why or why not admission, treatments were needed:1} Final Clinical Impression(s) / ED Diagnoses Final diagnoses:  None    Rx / DC Orders ED Discharge Orders     None

## 2024-01-01 ENCOUNTER — Emergency Department (HOSPITAL_COMMUNITY): Payer: 59

## 2024-01-01 DIAGNOSIS — J40 Bronchitis, not specified as acute or chronic: Secondary | ICD-10-CM | POA: Diagnosis not present

## 2024-01-01 LAB — RESP PANEL BY RT-PCR (RSV, FLU A&B, COVID)  RVPGX2
Influenza A by PCR: NEGATIVE
Influenza B by PCR: NEGATIVE
Resp Syncytial Virus by PCR: NEGATIVE
SARS Coronavirus 2 by RT PCR: NEGATIVE

## 2024-01-01 LAB — TROPONIN I (HIGH SENSITIVITY)
Troponin I (High Sensitivity): 9 ng/L (ref <18)
Troponin I (High Sensitivity): 9 ng/L (ref <18)

## 2024-01-01 MED ORDER — PREDNISONE 10 MG (21) PO TBPK: ORAL_TABLET | Freq: Every day | ORAL | 0 refills | Status: DC

## 2024-01-01 MED ORDER — LEVALBUTEROL TARTRATE 45 MCG/ACT IN AERO
1.0000 | INHALATION_SPRAY | Freq: Four times a day (QID) | RESPIRATORY_TRACT | Status: DC | PRN
  Filled 2024-01-01: qty 15

## 2024-01-01 MED ORDER — IOHEXOL 350 MG/ML SOLN
75.0000 mL | Freq: Once | INTRAVENOUS | Status: AC | PRN
  Administered 2024-01-01: 75 mL via INTRAVENOUS

## 2024-01-01 MED ORDER — METHYLPREDNISOLONE SODIUM SUCC 125 MG IJ SOLR
125.0000 mg | Freq: Once | INTRAMUSCULAR | Status: AC
  Administered 2024-01-01: 125 mg via INTRAVENOUS
  Filled 2024-01-01: qty 2

## 2024-01-01 MED ORDER — IPRATROPIUM BROMIDE 0.02 % IN SOLN
0.5000 mg | Freq: Once | RESPIRATORY_TRACT | Status: AC
  Administered 2024-01-01: 0.5 mg via RESPIRATORY_TRACT
  Filled 2024-01-01: qty 2.5

## 2024-01-01 MED ORDER — ACETAMINOPHEN 325 MG PO TABS
650.0000 mg | ORAL_TABLET | Freq: Once | ORAL | Status: AC
  Administered 2024-01-01: 650 mg via ORAL
  Filled 2024-01-01: qty 2

## 2024-01-01 MED ORDER — LEVALBUTEROL HCL 0.63 MG/3ML IN NEBU
0.6300 mg | INHALATION_SOLUTION | Freq: Once | RESPIRATORY_TRACT | Status: AC
  Administered 2024-01-01: 0.63 mg via RESPIRATORY_TRACT
  Filled 2024-01-01: qty 3

## 2024-01-01 MED ORDER — ONDANSETRON HCL 4 MG/2ML IJ SOLN
4.0000 mg | Freq: Once | INTRAMUSCULAR | Status: AC
  Administered 2024-01-01: 4 mg via INTRAVENOUS
  Filled 2024-01-01: qty 2

## 2024-01-01 NOTE — Progress Notes (Signed)
ED Pharmacy Antibiotic Sign Off An antibiotic consult was received from an ED provider for Cefepime per pharmacy dosing for pneumonia/bronchitis. A chart review was completed to assess appropriateness.   The following one time order(s) were placed:  Cefepime 2g IV x 1  Further antibiotic and/or antibiotic pharmacy consults should be ordered by the admitting provider if indicated.   Abran Duke, PharmD, BCPS Clinical Pharmacist Phone: [phone]

## 2024-01-01 NOTE — ED Notes (Signed)
Patient verbalizes understanding of discharge instructions. Opportunity for questioning and answers were provided. Armband removed by staff, pt discharged from ED. Pt ambulatory to ED waiting room with steady gait.

## 2024-01-01 NOTE — Discharge Instructions (Addendum)
The good news is that you do not have any pneumonia, you have bronchitis.  Please follow-up with your primary care doctor, I provided you with an inhaler, prior to going home, to use, as needed.  I have also provided you with some steroids, to help, with the inflammation.  You have difficulty breathing, worsening shortness of breath, please return to the ER

## 2024-02-19 ENCOUNTER — Other Ambulatory Visit (HOSPITAL_COMMUNITY): Payer: Self-pay

## 2024-02-20 ENCOUNTER — Other Ambulatory Visit: Payer: Self-pay

## 2024-02-20 ENCOUNTER — Emergency Department (HOSPITAL_COMMUNITY)

## 2024-02-20 ENCOUNTER — Emergency Department (HOSPITAL_COMMUNITY)
Admission: EM | Admit: 2024-02-20 | Discharge: 2024-02-20 | Disposition: A | Discharge status: Home / Self Care | Attending: Emergency Medicine | Admitting: Emergency Medicine

## 2024-02-20 DIAGNOSIS — J4541 Moderate persistent asthma with (acute) exacerbation: Secondary | ICD-10-CM | POA: Diagnosis not present

## 2024-02-20 DIAGNOSIS — R0602 Shortness of breath: Secondary | ICD-10-CM | POA: Diagnosis present

## 2024-02-20 LAB — CBC WITH DIFFERENTIAL/PLATELET
Abs Immature Granulocytes: 0.02 10*3/uL (ref 0.00–0.07)
Basophils Absolute: 0.1 10*3/uL (ref 0.0–0.1)
Basophils Relative: 1 %
Eosinophils Absolute: 0.6 10*3/uL — ABNORMAL HIGH (ref 0.0–0.5)
Eosinophils Relative: 10 %
HCT: 48.4 % — ABNORMAL HIGH (ref 36.0–46.0)
Hemoglobin: 14.9 g/dL (ref 12.0–15.0)
Immature Granulocytes: 0 %
Lymphocytes Relative: 35 %
Lymphs Abs: 2.2 10*3/uL (ref 0.7–4.0)
MCH: 27.1 pg (ref 26.0–34.0)
MCHC: 30.8 g/dL (ref 30.0–36.0)
MCV: 88.2 fL (ref 80.0–100.0)
Monocytes Absolute: 0.6 10*3/uL (ref 0.1–1.0)
Monocytes Relative: 9 %
Neutro Abs: 2.9 10*3/uL (ref 1.7–7.7)
Neutrophils Relative %: 45 %
Platelets: 230 10*3/uL (ref 150–400)
RBC: 5.49 MIL/uL — ABNORMAL HIGH (ref 3.87–5.11)
RDW: 13.2 % (ref 11.5–15.5)
WBC: 6.3 10*3/uL (ref 4.0–10.5)
nRBC: 0 % (ref 0.0–0.2)

## 2024-02-20 LAB — COMPREHENSIVE METABOLIC PANEL WITH GFR
ALT: 18 U/L (ref 0–44)
AST: 24 U/L (ref 15–41)
Albumin: 3.8 g/dL (ref 3.5–5.0)
Alkaline Phosphatase: 72 U/L (ref 38–126)
Anion gap: 11 (ref 5–15)
BUN: 8 mg/dL (ref 8–23)
CO2: 24 mmol/L (ref 22–32)
Calcium: 9 mg/dL (ref 8.9–10.3)
Chloride: 105 mmol/L (ref 98–111)
Creatinine, Ser: 0.85 mg/dL (ref 0.44–1.00)
GFR, Estimated: >60 mL/min (ref >60)
Glucose, Bld: 124 mg/dL — ABNORMAL HIGH (ref 70–99)
Potassium: 3.8 mmol/L (ref 3.5–5.1)
Sodium: 140 mmol/L (ref 135–145)
Total Bilirubin: 1.2 mg/dL (ref 0.0–1.2)
Total Protein: 7.5 g/dL (ref 6.5–8.1)

## 2024-02-20 MED ORDER — IPRATROPIUM-ALBUTEROL 0.5-2.5 (3) MG/3ML IN SOLN
3.0000 mL | Freq: Once | RESPIRATORY_TRACT | Status: AC
  Administered 2024-02-20: 3 mL via RESPIRATORY_TRACT
  Filled 2024-02-20: qty 3

## 2024-02-20 MED ORDER — LEVALBUTEROL HCL 0.63 MG/3ML IN NEBU
0.6300 mg | INHALATION_SOLUTION | Freq: Once | RESPIRATORY_TRACT | Status: AC
  Administered 2024-02-20: 0.63 mg via RESPIRATORY_TRACT
  Filled 2024-02-20: qty 3

## 2024-02-20 MED ORDER — ONDANSETRON HCL 4 MG/2ML IJ SOLN
4.0000 mg | Freq: Once | INTRAMUSCULAR | Status: AC
Start: 2024-02-20 — End: 2024-02-20
  Administered 2024-02-20: 4 mg via INTRAVENOUS
  Filled 2024-02-20: qty 2

## 2024-02-20 MED ORDER — PREDNISONE 20 MG PO TABS: 40.0000 mg | ORAL_TABLET | Freq: Every day | ORAL | 0 refills | Status: AC

## 2024-02-20 MED ORDER — METHYLPREDNISOLONE SODIUM SUCC 125 MG IJ SOLR
125.0000 mg | Freq: Once | INTRAMUSCULAR | Status: AC
  Administered 2024-02-20: 125 mg via INTRAVENOUS
  Filled 2024-02-20: qty 2

## 2024-02-20 MED ORDER — MAGNESIUM SULFATE 2 GM/50ML IV SOLN
2.0000 g | Freq: Once | INTRAVENOUS | Status: AC
  Administered 2024-02-20: 2 g via INTRAVENOUS
  Filled 2024-02-20: qty 50

## 2024-02-20 MED ORDER — IPRATROPIUM-ALBUTEROL 0.5-2.5 (3) MG/3ML IN SOLN
3.0000 mL | Freq: Once | RESPIRATORY_TRACT | Status: AC
Start: 2024-02-20 — End: 2024-02-20
  Administered 2024-02-20: 3 mL via RESPIRATORY_TRACT
  Filled 2024-02-20: qty 3

## 2024-02-20 MED ORDER — IPRATROPIUM-ALBUTEROL 0.5-2.5 (3) MG/3ML IN SOLN: 3.0000 mL | Freq: Once | RESPIRATORY_TRACT | Status: DC

## 2024-02-20 MED ORDER — ONDANSETRON 4 MG PO TBDP: 4.0000 mg | ORAL_TABLET | Freq: Once | ORAL | Status: DC

## 2024-02-20 MED ORDER — ATROVENT HFA 17 MCG/ACT IN AERS: 2.0000 | INHALATION_SPRAY | Freq: Four times a day (QID) | RESPIRATORY_TRACT | 12 refills | Status: AC | PRN

## 2024-02-20 NOTE — ED Provider Notes (Signed)
 [place] EMERGENCY DEPARTMENT AT [facility] Provider Note   CSN: [id number] Arrival date & time: 02/20/24  0253     History  Chief Complaint  Patient presents with   Shortness of Breath    Kayla Rowe is a 63 y.o. female with no pertinent past medical history presents emergency department for evaluation of shortness of breath, wheezing, productive cough for the past 3 days.  She was seen for similar symptoms on 12/31/2023 in ED and diagnosed with bronchitis. She was discharged with inhaler however has not taken it because she was concerned regarding having an allergic reaction from albuterol. No CP, fever, chills  The history is provided by the patient and a relative. The history is limited by a language barrier.  Shortness of Breath Associated symptoms: no abdominal pain, no chest pain, no cough, no fever, no headaches, no vomiting and no wheezing       Home Medications Prior to Admission medications   Medication Sig Start Date End Date Taking? Authorizing Provider  amoxicillin-clavulanate (AUGMENTIN) 875-125 MG tablet Take 1 tablet by mouth 2 (two) times daily. Patient not taking: Reported on 06/07/2017 04/26/17   Marcine Matar, MD  naproxen (NAPROSYN) 500 MG tablet Take 1 tablet (500 mg total) by mouth 2 (two) times daily as needed. Patient not taking: Reported on 06/07/2017 04/26/17   Marcine Matar, MD  predniSONE (STERAPRED UNI-PAK 21 TAB) 10 MG (21) TBPK tablet Take by mouth daily. Take 6 tabs by mouth daily  for 2 days, then 5 tabs for 2 days, then 4 tabs for 2 days, then 3 tabs for 2 days, 2 tabs for 2 days, then 1 tab by mouth daily for 2 days 01/01/24   Small, Brooke L, PA      Allergies    Patient has no known allergies.    Review of Systems   Review of Systems  Constitutional:  Negative for chills, fatigue and fever.  Respiratory:  Positive for shortness of breath. Negative for cough, chest tightness and wheezing.   Cardiovascular:  Negative for  chest pain and palpitations.  Gastrointestinal:  Negative for abdominal pain, constipation, diarrhea, nausea and vomiting.  Neurological:  Negative for dizziness, seizures, weakness, light-headedness, numbness and headaches.    Physical Exam Updated Vital Signs BP (!) 131/59   Pulse 60   Temp 97.8 F (36.6 C) (Oral)   Resp (!) 21   SpO2 100%  Physical Exam Vitals and nursing note reviewed.  Constitutional:      General: She is not in acute distress.    Appearance: Normal appearance.  HENT:     Head: Normocephalic and atraumatic.  Eyes:     Conjunctiva/sclera: Conjunctivae normal.  Cardiovascular:     Rate and Rhythm: Normal rate.  Pulmonary:     Effort: Pulmonary effort is normal. Tachypnea present. No respiratory distress.     Breath sounds: Wheezing present.     Comments: Audible wheezing. Difficulty with speaking in full and complete sentences Skin:    Coloration: Skin is not jaundiced or pale.  Neurological:     Mental Status: She is alert and oriented to person, place, and time. Mental status is at baseline.     ED Results / Procedures / Treatments   Labs (all labs ordered are listed, but only abnormal results are displayed) Labs Reviewed  CBC WITH DIFFERENTIAL/PLATELET - Abnormal; Notable for the following components:      Result Value   RBC 5.49 (*)  HCT 48.4 (*)    Eosinophils Absolute 0.6 (*)    All other components within normal limits  COMPREHENSIVE METABOLIC PANEL WITH GFR - Abnormal; Notable for the following components:   Glucose, Bld 124 (*)    All other components within normal limits    EKG EKG Interpretation Date/Time:  Thursday February 20 2024 03:20:38 EDT Ventricular Rate:  80 PR Interval:  154 QRS Duration:  76 QT Interval:  416 QTC Calculation: 479 R Axis:   153  Text Interpretation: Normal sinus rhythm Right axis deviation Low voltage QRS Cannot rule out Anterior infarct , age undetermined Abnormal ECG When compared with ECG of  31-Dec-2023 19:45, PREVIOUS ECG IS PRESENT Confirmed by Ross Marcus ([id number]) on 02/20/2024 4:14:21 AM  Radiology DG Chest 2 View Result Date: 02/20/2024 CLINICAL DATA:  Dyspnea, cough EXAM: CHEST - 2 VIEW COMPARISON:  None Available. FINDINGS: The heart size and mediastinal contours are within normal limits. Both lungs are clear. The visualized skeletal structures are unremarkable. IMPRESSION: No active cardiopulmonary disease. Electronically Signed   By: Helyn Numbers M.D.   On: 02/20/2024 03:35    Procedures Procedures    Medications Ordered in ED Medications  ipratropium-albuterol (DUONEB) 0.5-2.5 (3) MG/3ML nebulizer solution 3 mL (3 mLs Nebulization Given 02/20/24 0513)  methylPREDNISolone sodium succinate (SOLU-MEDROL) 125 mg/2 mL injection 125 mg (125 mg Intravenous Given 02/20/24 0527)  ondansetron (ZOFRAN) injection 4 mg (4 mg Intravenous Given 02/20/24 0546)  magnesium sulfate IVPB 2 g 50 mL (0 g Intravenous Stopped 02/20/24 0652)  ipratropium-albuterol (DUONEB) 0.5-2.5 (3) MG/3ML nebulizer solution 3 mL (3 mLs Nebulization Given 02/20/24 0546)  levalbuterol (XOPENEX) nebulizer solution 0.63 mg (0.63 mg Nebulization Given 02/20/24 0702)    ED Course/ Medical Decision Making/ A&P                                  Medical Decision Making Amount and/or Complexity of Data Reviewed Labs: ordered. Radiology: ordered.  Risk Prescription drug management.   Patient presents to the ED for concern of SHOB, wheezing, this involves an extensive number of treatment options, and is a complaint that carries with it a high risk of complications and morbidity.  The differential diagnosis includes COVID, flu, RSV, PNA, asthma or COPD exacerbation, bronchitis   Co morbidities that complicate the patient evaluation  See HPI   Additional history obtained:  Additional history obtained from [facility], Nursing, and Outside Medical Records   External records from outside source obtained and reviewed  including triage RN note, son at bedside, ED note from 12/31/23, CT PE results from 01/01/24   Lab Tests:  I Ordered, and personally interpreted labs.  The pertinent results include:  CBG 124   Imaging Studies ordered:  I ordered imaging studies including CXR  I independently visualized and interpreted imaging which showed no cardiopulmonary process I agree with the radiologist interpretation   Cardiac Monitoring:  The patient was maintained on a cardiac monitor.  I personally viewed and interpreted the cardiac monitored which showed an underlying rhythm of: NSR with no ischemia nor STE   Medicines ordered and prescription drug management:  I ordered medication including duoneb, solumedrol, mag  for wheezing  Reevaluation of the patient after these medicines showed that the patient stayed the same I have reviewed the patients home medicines and have made adjustments as needed    Problem List / ED Course:  Wheezing SHOB CXR neg for  acute cardiopulm pathology. Has been maintaining sats without O2 May be due to bronchitis vs undiagnosed asthma. Initially she told me she was diagnosed with asthma but then said many family members have asthma. No formal diagnosis on chart review.  Provided 2 duonebs, mag, solumedrol for wheezing with minimal improvement Upon chart review from ED note last month, patient reports that she has albuterol allergy. It is not in her allergy list. No allergic reaction following nebs noted. Will add this to her allergy list Had a negative CT PE study from 01/01/24 so I am doubtful this is related to a PE     Dispostion:  Dispo pending patient response to treatments. If no improvement, patient may need to be admitted.  Sign out to Luther Hearing PA   Final Clinical Impression(s) / ED Diagnoses Final diagnoses:  Moderate persistent asthma with exacerbation    Rx / DC Orders ED Discharge Orders     None         Judithann Sheen,  PA 02/20/24 [id number]    Shon Baton, MD 02/21/24 [phone]

## 2024-02-20 NOTE — Discharge Instructions (Addendum)
 Please take the entire course of steroids that we have prescribed and use the inhaler as needed.  Please call the pulmonologist, whose contact formation provided above to schedule close follow-up.

## 2024-02-20 NOTE — ED Triage Notes (Signed)
 Patient reports SOB with chest congestion  and productive cough for 2 days , denies fever or chills.

## 2024-02-20 NOTE — ED Provider Notes (Signed)
 Accepted handoff at shift change from Sabra Heck PA-C. Please see prior provider note for more detail.   Briefly: Patient is 63 y.o.   DDX: concern for Hx of wheezing although no formal diagnosis of asthma. Here with bilateral wheezing, gotten two duonebs, solumedrol, mag without improvement, trialing xopenex and plan to reassess. If no improvement, will plan to place on continuous neb and admit, otherwise home with steroids and inhaler.  CTPE study last month was unremarkable.   Plan: Reassess after levalbuterol and likely discharge on steroids, with pulm follow up, and atrovent After leave albuterol still with some wheezing but overall improvement, patient feels better and is requesting discharge at this time.  Will plan to discharge with steroid burst, atrovent, pulmonology follow up.   Olene Floss, PA-C 02/20/24 0745    Maia Plan, MD 02/24/24 [phone]

## 2024-04-10 ENCOUNTER — Ambulatory Visit: Admitting: Pulmonary Disease
